# Patient Record
Sex: Female | Born: 1965 | Race: White | Hispanic: No | Marital: Married | Smoking: Never smoker
Health system: Southern US, Community
[De-identification: ages and names within clinical notes are randomized; demographics above are authoritative.]

---

## 1999-02-14 ENCOUNTER — Other Ambulatory Visit: Admission: RE | Admit: 1999-02-14 | Discharge: 1999-02-14 | Payer: Self-pay | Admitting: Gynecology

## 2000-03-05 ENCOUNTER — Other Ambulatory Visit: Admission: RE | Admit: 2000-03-05 | Discharge: 2000-03-05 | Payer: Self-pay | Admitting: Gynecology

## 2000-03-27 ENCOUNTER — Encounter: Payer: Self-pay | Admitting: Urology

## 2000-03-27 ENCOUNTER — Encounter: Admission: RE | Admit: 2000-03-27 | Discharge: 2000-03-27 | Payer: Self-pay | Admitting: Internal Medicine

## 2000-04-01 ENCOUNTER — Encounter: Admission: RE | Admit: 2000-04-01 | Discharge: 2000-04-01 | Payer: Self-pay | Admitting: Urology

## 2000-04-01 ENCOUNTER — Encounter: Payer: Self-pay | Admitting: Urology

## 2000-04-22 ENCOUNTER — Encounter: Admission: RE | Admit: 2000-04-22 | Discharge: 2000-04-22 | Payer: Self-pay | Admitting: Urology

## 2000-04-22 ENCOUNTER — Encounter: Payer: Self-pay | Admitting: Urology

## 2001-07-30 ENCOUNTER — Encounter: Payer: Self-pay | Admitting: Obstetrics & Gynecology

## 2001-07-30 ENCOUNTER — Ambulatory Visit (HOSPITAL_COMMUNITY): Admission: RE | Admit: 2001-07-30 | Discharge: 2001-07-30 | Payer: Self-pay | Admitting: Obstetrics & Gynecology

## 2001-08-27 ENCOUNTER — Ambulatory Visit (HOSPITAL_COMMUNITY): Admission: RE | Admit: 2001-08-27 | Discharge: 2001-08-27 | Payer: Self-pay | Admitting: Obstetrics & Gynecology

## 2001-08-27 ENCOUNTER — Encounter: Payer: Self-pay | Admitting: Obstetrics & Gynecology

## 2001-12-19 ENCOUNTER — Inpatient Hospital Stay (HOSPITAL_COMMUNITY): Admission: AD | Admit: 2001-12-19 | Discharge: 2001-12-24 | Payer: Self-pay | Admitting: Obstetrics and Gynecology

## 2001-12-19 ENCOUNTER — Encounter (INDEPENDENT_AMBULATORY_CARE_PROVIDER_SITE_OTHER): Payer: Self-pay | Admitting: Specialist

## 2002-01-21 ENCOUNTER — Other Ambulatory Visit: Admission: RE | Admit: 2002-01-21 | Discharge: 2002-01-21 | Payer: Self-pay | Admitting: Obstetrics and Gynecology

## 2003-02-27 ENCOUNTER — Other Ambulatory Visit: Admission: RE | Admit: 2003-02-27 | Discharge: 2003-02-27 | Payer: Self-pay | Admitting: Gynecology

## 2004-05-16 ENCOUNTER — Other Ambulatory Visit: Admission: RE | Admit: 2004-05-16 | Discharge: 2004-05-16 | Payer: Self-pay | Admitting: Gynecology

## 2005-04-23 ENCOUNTER — Encounter: Admission: RE | Admit: 2005-04-23 | Discharge: 2005-04-23 | Payer: Self-pay | Admitting: Gynecology

## 2006-04-29 ENCOUNTER — Encounter: Admission: RE | Admit: 2006-04-29 | Discharge: 2006-04-29 | Payer: Self-pay | Admitting: Gynecology

## 2007-04-30 ENCOUNTER — Encounter: Admission: RE | Admit: 2007-04-30 | Discharge: 2007-04-30 | Payer: Self-pay | Admitting: Gynecology

## 2008-05-01 ENCOUNTER — Encounter: Admission: RE | Admit: 2008-05-01 | Discharge: 2008-05-01 | Payer: Self-pay | Admitting: Gynecology

## 2009-01-26 ENCOUNTER — Encounter: Admission: RE | Admit: 2009-01-26 | Discharge: 2009-01-26 | Payer: Self-pay | Admitting: Internal Medicine

## 2009-05-17 ENCOUNTER — Encounter: Admission: RE | Admit: 2009-05-17 | Discharge: 2009-05-17 | Payer: Self-pay | Admitting: Gynecology

## 2010-06-07 NOTE — Discharge Summary (Signed)
NAME:  Brenda Nash, Brenda Nash NO.:  [id number]   MEDICAL RECORD NO.:  [id number]                   PATIENT TYPE:  INP   LOCATION:  9108                                 FACILITY:  [facility]   PHYSICIAN:  Randye Lobo, M.D.                DATE OF BIRTH:  October 29, 1965   DATE OF ADMISSION:  12/18/2001  DATE OF DISCHARGE:  12/24/2001                                 DISCHARGE SUMMARY   FINAL DIAGNOSES:  1. Twin gestation at 35-5/7 weeks.  2. HELLP syndrome.  3. Unfavorable cervix.   PROCEDURE:  Primary low transverse cesarean section.   SURGEON:  Carrington Clamp, M.D.   ASSISTANT:  Tracie Harrier, M.D.   COMPLICATIONS:  None.   HISTORY OF PRESENT ILLNESS:  This 45 year old G1, P0 was admitted at 35-6/[redacted]  weeks gestation with twins.  The patient was complaining of intermittent  chills and back pain.  The patient was started on terbutaline at this point  to stop her contractions.  The patient's antepartum course had been  complicated by in vitro fertilization which produced twins, a history of  HSV, and advanced maternal age.  The patient declined the amniocentesis.   HOSPITAL COURSE:  Upon admission to the hospital, the patient started having  some upper abdominal and lower back pain.  Labs showed an elevated LDH and  elevated liver function tests.  The patient's blood pressure started mild  elevations.  An ultrasound showed a normal AFI with good fetal heart tones.  A discussion was held with the patient and her family regarding delivery of  the twins secondary to HELLP syndrome and decision was made to proceed with  a cesarean section instead of a prolonged induction.  The patient was taken  to the operating room on December 19, 2001, by Dr. Carrington Clamp where a  primary low transverse cesarean section was performed with the delivery of  twin A weighing 5 pounds with Apgars of 8 and 9 and twin B weighing 6 pounds  with Apgars of 8 and 9.  Both were  males.  Delivery went without  complications.  Magnesium sulfate was started postoperatively.  After  delivery, the patient's liver function tests started normalizing and she was  diuresing.  She was able to be stopped on her magnesium sulfate on  postoperative day #1.  The patient also began to have some abdominal  distention.  A white blood cell count was performed on December 21, 2001,  which showed a white blood cell count of 15.8.  The patient did not have a  fever but endometritis was considered.  On postoperative day #3, the patient  was having some discomfort from her edema and distention of her abdomen.  White blood cell counts were checked to rule out endometritis.  The patient  was started on some hydrochlorothiazide for her edema.  Labs began to  normalize and by postoperative day #5,  the patient was felt ready for  discharge.    DISPOSITION:  She was sent home on hydrochlorothiazide for her lower  extremity edema, told to decrease activity, was given a prescription for  Percocet one to two every four hours as needed for pain, to continue her  prenatal vitamins and was to follow up in the office in one week.   LABS ON DISCHARGE:  Normal liver function tests and a postoperative  hemoglobin of 10.7.     Leilani Able, P.A.-C.                Randye Lobo, M.D.    MB/MEDQ  D:  02/02/2002  T:  02/02/2002  Job:  [id number]

## 2010-06-07 NOTE — Op Note (Signed)
NAME:  Brenda Nash, Brenda Nash NO.:  [id number]   MEDICAL RECORD NO.:  [id number]                   PATIENT TYPE:  INP   LOCATION:  9156                                 FACILITY:  [facility]   PHYSICIAN:  Carrington Clamp, M.D.              DATE OF BIRTH:  01-Feb-1965   DATE OF PROCEDURE:  12/19/2001  DATE OF DISCHARGE:                                 OPERATIVE REPORT   PREOPERATIVE DIAGNOSES:  1. Twins at 58 and five-sevenths weeks.  2. HELLP syndrome.  3. Unfavorable cervix.   POSTOPERATIVE DIAGNOSES:  1. Twins at 22 and five-sevenths weeks.  2. HELLP syndrome.  3. Unfavorable cervix.   PROCEDURE:  Primary low transverse cesarean section.   SURGEON:  Carrington Clamp, M.D.   ASSISTANT:  Tracie Harrier, M.D.   ANESTHESIA:  Spinal.   ESTIMATED BLOOD LOSS:  800 cc.   INTRAVENOUS FLUIDS:  1000 cc.   URINE OUTPUT:  250 cc.   COMPLICATIONS:  None.   FINDINGS:  A was female infant, Apgars 8 and 9, weighing 5 pounds in the  vertex presentation.  B was female, Apgars 8 and 9, 6 pounds, in the breech  presentation.  Normal tubes, ovaries, and uterus were seen.  However, there  were small, less than 2 cm fibroids scattered on the serosal surface of the  uterus.   PATHOLOGY:  Placenta.   MEDICATIONS:  Ancef and Pitocin.   COUNTS:  Counts were correct x3.   POSTOPERATIVE MEDICATIONS:  Magnesium sulfate was started postoperatively.   TECHNIQUE:  After adequate spinal anesthesia was achieved the patient was  prepped and draped in the usual sterile fashion, dorsal supine position with  leftward tilt.  A Pfannenstiel skin incision was made with the scalpel,  carried down to the fascia with the Bovie cautery.  The fascia was incised  in the midline and carried in a transverse curvilinear manner with the Mayo  scissors.  The fascia was reflected superiorly and inferiorly with the Mayo  scissors off of the rectus muscles and the rectus muscles split in the  midline.  The L3 portion of the peritoneum was entered into bluntly and the  peritoneum was incised in a superior-inferior manner with the Metzenbaum  scissors.   The bladder blade was placed and the vesicouterine fascia tented up and  incised in a transverse curvilinear manner with the Metzenbaum scissors.  The bladder flap was created bluntly and the bladder blade replaced.  A 2 cm  incision was made in the upper portion of the lower uterine segment and this  was carried in a transverse curvilinear manner with the bandage scissors.  Clear fluid was noted upon entry of both amniotic cavities and baby A was  delivered vertex, bulb suctioned, cord clamped and cut, and handed to  waiting pediatrics.  Baby B was delivered breech in the usual fashion and  was also bulb suctioned, with the cord  clamped and cut.  B was also handed  over to waiting pediatrics.   Cord bloods were then obtained and the placenta delivered in a manual  fashion.  The uterus was then exteriorized, wrapped in wet lap, and cleared  of all debris.  The incision was closed with a running lock stitch of 0  Monocryl.  Inspection of the incision found a few small areas of bleeding  and four figure-of-eight stitches were used to secure hemostasis.  The  uterus was then reapproximated in the abdomen and the abdomen irrigated and  the uterine incision reinspected and found to be hemostatic.   The peritoneum was then closed with a running stitch of 2-0 Vicryl.  The  fascia was closed with a running stitch of 0 Vicryl.  The subcutaneous  tissue was rendered hemostatic with the Bovie cautery.  It was slightly  edematous but was relatively thin - just under  3 cm - and so a drain was forgone.  Instead, five interrupted stitches of 3  plain gut were placed to close the subcutaneous space.  The skin was closed  with staples.  The patient tolerated the procedure well and returned to  recovery room in stable condition.                                                Carrington Clamp, M.D.    MH/MEDQ  D:  12/19/2001  T:  12/19/2001  Job:  [id number]

## 2010-06-11 ENCOUNTER — Other Ambulatory Visit: Payer: Self-pay | Admitting: Gynecology

## 2010-06-11 DIAGNOSIS — Z1231 Encounter for screening mammogram for malignant neoplasm of breast: Secondary | ICD-10-CM

## 2010-06-14 ENCOUNTER — Ambulatory Visit
Admission: RE | Admit: 2010-06-14 | Discharge: 2010-06-14 | Disposition: A | Discharge status: Home / Self Care | Payer: 59 | Source: Ambulatory Visit | Attending: Gynecology | Admitting: Gynecology

## 2010-06-14 DIAGNOSIS — Z1231 Encounter for screening mammogram for malignant neoplasm of breast: Secondary | ICD-10-CM

## 2011-07-01 ENCOUNTER — Other Ambulatory Visit: Payer: Self-pay | Admitting: Gynecology

## 2011-07-01 DIAGNOSIS — Z1231 Encounter for screening mammogram for malignant neoplasm of breast: Secondary | ICD-10-CM

## 2011-07-09 ENCOUNTER — Ambulatory Visit
Admission: RE | Admit: 2011-07-09 | Discharge: 2011-07-09 | Disposition: A | Discharge status: Home / Self Care | Payer: 59 | Source: Ambulatory Visit | Attending: Gynecology | Admitting: Gynecology

## 2011-07-09 DIAGNOSIS — Z1231 Encounter for screening mammogram for malignant neoplasm of breast: Secondary | ICD-10-CM

## 2012-09-09 ENCOUNTER — Other Ambulatory Visit: Payer: Self-pay

## 2012-09-09 DIAGNOSIS — Z1231 Encounter for screening mammogram for malignant neoplasm of breast: Secondary | ICD-10-CM

## 2012-09-29 ENCOUNTER — Ambulatory Visit
Admission: RE | Admit: 2012-09-29 | Discharge: 2012-09-29 | Disposition: A | Discharge status: Home / Self Care | Payer: 59 | Source: Ambulatory Visit

## 2012-09-29 DIAGNOSIS — Z1231 Encounter for screening mammogram for malignant neoplasm of breast: Secondary | ICD-10-CM

## 2012-12-23 ENCOUNTER — Other Ambulatory Visit: Payer: Self-pay | Admitting: Gynecology

## 2013-12-12 ENCOUNTER — Other Ambulatory Visit: Payer: Self-pay

## 2013-12-12 DIAGNOSIS — Z1231 Encounter for screening mammogram for malignant neoplasm of breast: Secondary | ICD-10-CM

## 2014-01-02 ENCOUNTER — Ambulatory Visit: Payer: 59

## 2014-01-03 ENCOUNTER — Ambulatory Visit
Admission: RE | Admit: 2014-01-03 | Discharge: 2014-01-03 | Disposition: A | Discharge status: Home / Self Care | Payer: 59 | Source: Ambulatory Visit

## 2014-01-03 DIAGNOSIS — Z1231 Encounter for screening mammogram for malignant neoplasm of breast: Secondary | ICD-10-CM

## 2014-01-05 ENCOUNTER — Other Ambulatory Visit: Payer: Self-pay | Admitting: Obstetrics & Gynecology

## 2014-01-05 DIAGNOSIS — R928 Other abnormal and inconclusive findings on diagnostic imaging of breast: Secondary | ICD-10-CM

## 2014-01-10 ENCOUNTER — Ambulatory Visit
Admission: RE | Admit: 2014-01-10 | Discharge: 2014-01-10 | Disposition: A | Discharge status: Home / Self Care | Payer: 59 | Source: Ambulatory Visit | Attending: Obstetrics & Gynecology | Admitting: Obstetrics & Gynecology

## 2014-01-10 DIAGNOSIS — R928 Other abnormal and inconclusive findings on diagnostic imaging of breast: Secondary | ICD-10-CM

## 2014-01-23 ENCOUNTER — Other Ambulatory Visit: Payer: 59

## 2015-02-19 ENCOUNTER — Other Ambulatory Visit: Payer: Self-pay

## 2015-02-19 DIAGNOSIS — Z1231 Encounter for screening mammogram for malignant neoplasm of breast: Secondary | ICD-10-CM

## 2015-03-02 ENCOUNTER — Ambulatory Visit
Admission: RE | Admit: 2015-03-02 | Discharge: 2015-03-02 | Disposition: A | Discharge status: Home / Self Care | Payer: Self-pay | Source: Ambulatory Visit

## 2015-03-02 DIAGNOSIS — Z1231 Encounter for screening mammogram for malignant neoplasm of breast: Secondary | ICD-10-CM

## 2015-11-21 ENCOUNTER — Other Ambulatory Visit: Payer: Self-pay | Admitting: Obstetrics & Gynecology

## 2015-11-23 LAB — CYTOLOGY - PAP

## 2016-02-07 DIAGNOSIS — Z23 Encounter for immunization: Secondary | ICD-10-CM | POA: Diagnosis not present

## 2016-02-27 ENCOUNTER — Other Ambulatory Visit: Payer: Self-pay | Admitting: Obstetrics & Gynecology

## 2016-02-27 DIAGNOSIS — Z1231 Encounter for screening mammogram for malignant neoplasm of breast: Secondary | ICD-10-CM

## 2016-03-26 DIAGNOSIS — H6983 Other specified disorders of Eustachian tube, bilateral: Secondary | ICD-10-CM | POA: Diagnosis not present

## 2016-03-26 DIAGNOSIS — J301 Allergic rhinitis due to pollen: Secondary | ICD-10-CM | POA: Diagnosis not present

## 2016-04-09 ENCOUNTER — Ambulatory Visit: Payer: 59

## 2016-04-29 ENCOUNTER — Ambulatory Visit
Admission: RE | Admit: 2016-04-29 | Discharge: 2016-04-29 | Disposition: A | Discharge status: Home / Self Care | Payer: 59 | Source: Ambulatory Visit | Attending: Obstetrics & Gynecology | Admitting: Obstetrics & Gynecology

## 2016-04-29 DIAGNOSIS — Z1231 Encounter for screening mammogram for malignant neoplasm of breast: Secondary | ICD-10-CM | POA: Diagnosis not present

## 2016-05-30 DIAGNOSIS — Z0000 Encounter for general adult medical examination without abnormal findings: Secondary | ICD-10-CM | POA: Diagnosis not present

## 2016-07-28 DIAGNOSIS — Z1211 Encounter for screening for malignant neoplasm of colon: Secondary | ICD-10-CM | POA: Diagnosis not present

## 2016-11-06 DIAGNOSIS — M9903 Segmental and somatic dysfunction of lumbar region: Secondary | ICD-10-CM | POA: Diagnosis not present

## 2016-11-06 DIAGNOSIS — M9902 Segmental and somatic dysfunction of thoracic region: Secondary | ICD-10-CM | POA: Diagnosis not present

## 2016-11-06 DIAGNOSIS — M9901 Segmental and somatic dysfunction of cervical region: Secondary | ICD-10-CM | POA: Diagnosis not present

## 2016-11-07 DIAGNOSIS — M9903 Segmental and somatic dysfunction of lumbar region: Secondary | ICD-10-CM | POA: Diagnosis not present

## 2016-11-07 DIAGNOSIS — M9901 Segmental and somatic dysfunction of cervical region: Secondary | ICD-10-CM | POA: Diagnosis not present

## 2016-11-07 DIAGNOSIS — M9902 Segmental and somatic dysfunction of thoracic region: Secondary | ICD-10-CM | POA: Diagnosis not present

## 2016-11-12 DIAGNOSIS — M9902 Segmental and somatic dysfunction of thoracic region: Secondary | ICD-10-CM | POA: Diagnosis not present

## 2016-11-12 DIAGNOSIS — M9901 Segmental and somatic dysfunction of cervical region: Secondary | ICD-10-CM | POA: Diagnosis not present

## 2016-11-12 DIAGNOSIS — M9903 Segmental and somatic dysfunction of lumbar region: Secondary | ICD-10-CM | POA: Diagnosis not present

## 2016-11-14 DIAGNOSIS — M9901 Segmental and somatic dysfunction of cervical region: Secondary | ICD-10-CM | POA: Diagnosis not present

## 2016-11-14 DIAGNOSIS — M9902 Segmental and somatic dysfunction of thoracic region: Secondary | ICD-10-CM | POA: Diagnosis not present

## 2016-11-14 DIAGNOSIS — M9903 Segmental and somatic dysfunction of lumbar region: Secondary | ICD-10-CM | POA: Diagnosis not present

## 2016-11-19 DIAGNOSIS — M9901 Segmental and somatic dysfunction of cervical region: Secondary | ICD-10-CM | POA: Diagnosis not present

## 2016-11-19 DIAGNOSIS — M9902 Segmental and somatic dysfunction of thoracic region: Secondary | ICD-10-CM | POA: Diagnosis not present

## 2016-11-19 DIAGNOSIS — M9903 Segmental and somatic dysfunction of lumbar region: Secondary | ICD-10-CM | POA: Diagnosis not present

## 2016-11-21 DIAGNOSIS — M9902 Segmental and somatic dysfunction of thoracic region: Secondary | ICD-10-CM | POA: Diagnosis not present

## 2016-11-21 DIAGNOSIS — M9901 Segmental and somatic dysfunction of cervical region: Secondary | ICD-10-CM | POA: Diagnosis not present

## 2016-11-21 DIAGNOSIS — M9903 Segmental and somatic dysfunction of lumbar region: Secondary | ICD-10-CM | POA: Diagnosis not present

## 2016-11-26 DIAGNOSIS — M9902 Segmental and somatic dysfunction of thoracic region: Secondary | ICD-10-CM | POA: Diagnosis not present

## 2016-11-26 DIAGNOSIS — M9901 Segmental and somatic dysfunction of cervical region: Secondary | ICD-10-CM | POA: Diagnosis not present

## 2016-11-26 DIAGNOSIS — M9903 Segmental and somatic dysfunction of lumbar region: Secondary | ICD-10-CM | POA: Diagnosis not present

## 2017-01-08 DIAGNOSIS — H04123 Dry eye syndrome of bilateral lacrimal glands: Secondary | ICD-10-CM | POA: Diagnosis not present

## 2017-03-24 DIAGNOSIS — D1801 Hemangioma of skin and subcutaneous tissue: Secondary | ICD-10-CM | POA: Diagnosis not present

## 2017-03-24 DIAGNOSIS — D2262 Melanocytic nevi of left upper limb, including shoulder: Secondary | ICD-10-CM | POA: Diagnosis not present

## 2017-03-24 DIAGNOSIS — L7211 Pilar cyst: Secondary | ICD-10-CM | POA: Diagnosis not present

## 2017-04-29 DIAGNOSIS — D3122 Benign neoplasm of left retina: Secondary | ICD-10-CM | POA: Diagnosis not present

## 2017-05-01 DIAGNOSIS — R42 Dizziness and giddiness: Secondary | ICD-10-CM | POA: Diagnosis not present

## 2017-05-01 DIAGNOSIS — M79609 Pain in unspecified limb: Secondary | ICD-10-CM | POA: Diagnosis not present

## 2017-05-01 DIAGNOSIS — R0600 Dyspnea, unspecified: Secondary | ICD-10-CM | POA: Diagnosis not present

## 2017-05-18 ENCOUNTER — Other Ambulatory Visit: Payer: Self-pay | Admitting: Obstetrics & Gynecology

## 2017-05-18 DIAGNOSIS — Z1231 Encounter for screening mammogram for malignant neoplasm of breast: Secondary | ICD-10-CM

## 2017-05-20 HISTORY — PX: BREAST BIOPSY: SHX20

## 2017-06-02 DIAGNOSIS — Z0000 Encounter for general adult medical examination without abnormal findings: Secondary | ICD-10-CM | POA: Diagnosis not present

## 2017-06-02 DIAGNOSIS — Z1231 Encounter for screening mammogram for malignant neoplasm of breast: Secondary | ICD-10-CM | POA: Diagnosis not present

## 2017-06-02 DIAGNOSIS — Z1211 Encounter for screening for malignant neoplasm of colon: Secondary | ICD-10-CM | POA: Diagnosis not present

## 2017-06-08 ENCOUNTER — Ambulatory Visit
Admission: RE | Admit: 2017-06-08 | Discharge: 2017-06-08 | Disposition: A | Discharge status: Home / Self Care | Payer: 59 | Source: Ambulatory Visit | Attending: Obstetrics & Gynecology | Admitting: Obstetrics & Gynecology

## 2017-06-08 DIAGNOSIS — Z1231 Encounter for screening mammogram for malignant neoplasm of breast: Secondary | ICD-10-CM | POA: Diagnosis not present

## 2017-06-09 ENCOUNTER — Other Ambulatory Visit: Payer: Self-pay | Admitting: Obstetrics & Gynecology

## 2017-06-09 DIAGNOSIS — R921 Mammographic calcification found on diagnostic imaging of breast: Secondary | ICD-10-CM

## 2017-06-12 ENCOUNTER — Other Ambulatory Visit: Payer: Self-pay | Admitting: Obstetrics & Gynecology

## 2017-06-12 ENCOUNTER — Ambulatory Visit
Admission: RE | Admit: 2017-06-12 | Discharge: 2017-06-12 | Disposition: A | Discharge status: Home / Self Care | Payer: 59 | Source: Ambulatory Visit | Attending: Obstetrics & Gynecology | Admitting: Obstetrics & Gynecology

## 2017-06-12 DIAGNOSIS — R921 Mammographic calcification found on diagnostic imaging of breast: Secondary | ICD-10-CM

## 2017-08-04 DIAGNOSIS — E7800 Pure hypercholesterolemia, unspecified: Secondary | ICD-10-CM | POA: Diagnosis not present

## 2017-08-26 DIAGNOSIS — E7800 Pure hypercholesterolemia, unspecified: Secondary | ICD-10-CM | POA: Diagnosis not present

## 2017-08-28 DIAGNOSIS — H15102 Unspecified episcleritis, left eye: Secondary | ICD-10-CM | POA: Diagnosis not present

## 2017-09-11 DIAGNOSIS — H15102 Unspecified episcleritis, left eye: Secondary | ICD-10-CM | POA: Diagnosis not present

## 2017-09-11 DIAGNOSIS — H11152 Pinguecula, left eye: Secondary | ICD-10-CM | POA: Diagnosis not present

## 2017-09-23 ENCOUNTER — Ambulatory Visit (HOSPITAL_COMMUNITY)
Admission: RE | Admit: 2017-09-23 | Discharge: 2017-09-23 | Disposition: A | Discharge status: Home / Self Care | Payer: 59 | Source: Ambulatory Visit | Attending: Internal Medicine | Admitting: Internal Medicine

## 2017-09-23 ENCOUNTER — Other Ambulatory Visit (HOSPITAL_COMMUNITY): Payer: Self-pay | Admitting: Nurse Practitioner

## 2017-09-23 DIAGNOSIS — M25572 Pain in left ankle and joints of left foot: Secondary | ICD-10-CM | POA: Insufficient documentation

## 2017-09-23 DIAGNOSIS — R52 Pain, unspecified: Secondary | ICD-10-CM | POA: Diagnosis not present

## 2017-09-23 DIAGNOSIS — S93432A Sprain of tibiofibular ligament of left ankle, initial encounter: Secondary | ICD-10-CM | POA: Diagnosis not present

## 2017-09-23 DIAGNOSIS — Z23 Encounter for immunization: Secondary | ICD-10-CM | POA: Diagnosis not present

## 2017-09-23 NOTE — Progress Notes (Signed)
*  Preliminary Results* Right lower extremity venous duplex completed. Right lower extremity is negative for deep vein thrombosis. There is no evidence of right Baker's cyst.  09/23/2017 11:15 AM  Abram Sander

## 2017-10-15 DIAGNOSIS — Z01419 Encounter for gynecological examination (general) (routine) without abnormal findings: Secondary | ICD-10-CM | POA: Diagnosis not present

## 2017-11-18 DIAGNOSIS — M79671 Pain in right foot: Secondary | ICD-10-CM | POA: Diagnosis not present

## 2017-11-18 DIAGNOSIS — M2011 Hallux valgus (acquired), right foot: Secondary | ICD-10-CM | POA: Diagnosis not present

## 2017-11-18 DIAGNOSIS — M79672 Pain in left foot: Secondary | ICD-10-CM | POA: Diagnosis not present

## 2017-12-14 DIAGNOSIS — M2011 Hallux valgus (acquired), right foot: Secondary | ICD-10-CM | POA: Diagnosis not present

## 2017-12-14 DIAGNOSIS — M2012 Hallux valgus (acquired), left foot: Secondary | ICD-10-CM | POA: Diagnosis not present

## 2017-12-14 DIAGNOSIS — M21612 Bunion of left foot: Secondary | ICD-10-CM | POA: Diagnosis not present

## 2017-12-14 DIAGNOSIS — S93144A Subluxation of metatarsophalangeal joint of right lesser toe(s), initial encounter: Secondary | ICD-10-CM | POA: Diagnosis not present

## 2017-12-14 DIAGNOSIS — M79672 Pain in left foot: Secondary | ICD-10-CM | POA: Diagnosis not present

## 2017-12-14 DIAGNOSIS — M21611 Bunion of right foot: Secondary | ICD-10-CM | POA: Diagnosis not present

## 2017-12-14 DIAGNOSIS — M79671 Pain in right foot: Secondary | ICD-10-CM | POA: Diagnosis not present

## 2017-12-28 DIAGNOSIS — E7800 Pure hypercholesterolemia, unspecified: Secondary | ICD-10-CM | POA: Diagnosis not present

## 2017-12-29 DIAGNOSIS — Z8249 Family history of ischemic heart disease and other diseases of the circulatory system: Secondary | ICD-10-CM | POA: Diagnosis not present

## 2017-12-29 DIAGNOSIS — E7800 Pure hypercholesterolemia, unspecified: Secondary | ICD-10-CM | POA: Diagnosis not present

## 2017-12-29 DIAGNOSIS — Z23 Encounter for immunization: Secondary | ICD-10-CM | POA: Diagnosis not present

## 2018-01-05 DIAGNOSIS — M216X1 Other acquired deformities of right foot: Secondary | ICD-10-CM | POA: Diagnosis not present

## 2018-01-05 DIAGNOSIS — M66871 Spontaneous rupture of other tendons, right ankle and foot: Secondary | ICD-10-CM | POA: Diagnosis not present

## 2018-01-05 DIAGNOSIS — M2011 Hallux valgus (acquired), right foot: Secondary | ICD-10-CM | POA: Diagnosis not present

## 2018-01-05 DIAGNOSIS — S93144A Subluxation of metatarsophalangeal joint of right lesser toe(s), initial encounter: Secondary | ICD-10-CM | POA: Diagnosis not present

## 2018-01-06 DIAGNOSIS — M2011 Hallux valgus (acquired), right foot: Secondary | ICD-10-CM | POA: Diagnosis not present

## 2018-01-19 DIAGNOSIS — S93144D Subluxation of metatarsophalangeal joint of right lesser toe(s), subsequent encounter: Secondary | ICD-10-CM | POA: Diagnosis not present

## 2018-01-19 DIAGNOSIS — S93521D Sprain of metatarsophalangeal joint of right great toe, subsequent encounter: Secondary | ICD-10-CM | POA: Diagnosis not present

## 2018-01-19 DIAGNOSIS — M2011 Hallux valgus (acquired), right foot: Secondary | ICD-10-CM | POA: Diagnosis not present

## 2018-02-03 DIAGNOSIS — M2011 Hallux valgus (acquired), right foot: Secondary | ICD-10-CM | POA: Diagnosis not present

## 2018-03-03 DIAGNOSIS — M2011 Hallux valgus (acquired), right foot: Secondary | ICD-10-CM | POA: Diagnosis not present

## 2018-03-31 DIAGNOSIS — M2011 Hallux valgus (acquired), right foot: Secondary | ICD-10-CM | POA: Diagnosis not present

## 2018-04-01 DIAGNOSIS — M25676 Stiffness of unspecified foot, not elsewhere classified: Secondary | ICD-10-CM | POA: Diagnosis not present

## 2018-04-01 DIAGNOSIS — R262 Difficulty in walking, not elsewhere classified: Secondary | ICD-10-CM | POA: Diagnosis not present

## 2018-04-01 DIAGNOSIS — M21619 Bunion of unspecified foot: Secondary | ICD-10-CM | POA: Diagnosis not present

## 2018-04-01 DIAGNOSIS — M21611 Bunion of right foot: Secondary | ICD-10-CM | POA: Diagnosis not present

## 2018-04-05 DIAGNOSIS — M21619 Bunion of unspecified foot: Secondary | ICD-10-CM | POA: Diagnosis not present

## 2018-04-05 DIAGNOSIS — M25676 Stiffness of unspecified foot, not elsewhere classified: Secondary | ICD-10-CM | POA: Diagnosis not present

## 2018-04-05 DIAGNOSIS — R262 Difficulty in walking, not elsewhere classified: Secondary | ICD-10-CM | POA: Diagnosis not present

## 2018-04-05 DIAGNOSIS — M21611 Bunion of right foot: Secondary | ICD-10-CM | POA: Diagnosis not present

## 2018-04-28 ENCOUNTER — Other Ambulatory Visit: Payer: Self-pay | Admitting: Obstetrics and Gynecology

## 2018-04-28 DIAGNOSIS — Z1231 Encounter for screening mammogram for malignant neoplasm of breast: Secondary | ICD-10-CM

## 2018-05-27 DIAGNOSIS — E7800 Pure hypercholesterolemia, unspecified: Secondary | ICD-10-CM | POA: Diagnosis not present

## 2018-05-27 DIAGNOSIS — L659 Nonscarring hair loss, unspecified: Secondary | ICD-10-CM | POA: Diagnosis not present

## 2018-05-28 DIAGNOSIS — E7800 Pure hypercholesterolemia, unspecified: Secondary | ICD-10-CM | POA: Diagnosis not present

## 2018-06-01 DIAGNOSIS — D225 Melanocytic nevi of trunk: Secondary | ICD-10-CM | POA: Diagnosis not present

## 2018-06-01 DIAGNOSIS — D2261 Melanocytic nevi of right upper limb, including shoulder: Secondary | ICD-10-CM | POA: Diagnosis not present

## 2018-06-01 DIAGNOSIS — D2271 Melanocytic nevi of right lower limb, including hip: Secondary | ICD-10-CM | POA: Diagnosis not present

## 2018-06-01 DIAGNOSIS — D2262 Melanocytic nevi of left upper limb, including shoulder: Secondary | ICD-10-CM | POA: Diagnosis not present

## 2018-06-16 DIAGNOSIS — M2011 Hallux valgus (acquired), right foot: Secondary | ICD-10-CM | POA: Diagnosis not present

## 2018-06-16 DIAGNOSIS — S93521D Sprain of metatarsophalangeal joint of right great toe, subsequent encounter: Secondary | ICD-10-CM | POA: Diagnosis not present

## 2018-06-16 DIAGNOSIS — S93144D Subluxation of metatarsophalangeal joint of right lesser toe(s), subsequent encounter: Secondary | ICD-10-CM | POA: Diagnosis not present

## 2018-06-28 ENCOUNTER — Ambulatory Visit
Admission: RE | Admit: 2018-06-28 | Discharge: 2018-06-28 | Disposition: A | Discharge status: Home / Self Care | Payer: BC Managed Care – PPO | Source: Ambulatory Visit | Attending: Obstetrics and Gynecology | Admitting: Obstetrics and Gynecology

## 2018-06-28 ENCOUNTER — Other Ambulatory Visit: Payer: Self-pay

## 2018-06-28 DIAGNOSIS — Z1231 Encounter for screening mammogram for malignant neoplasm of breast: Secondary | ICD-10-CM | POA: Diagnosis not present

## 2018-06-28 IMAGING — MG DIGITAL SCREENING BILATERAL MAMMOGRAM WITH TOMO AND CAD
6 of 10 series · 6 of 30 positions shown · non-contrast
Comparison: Previous exam(s).

CLINICAL DATA: Screening.

EXAM:
DIGITAL SCREENING BILATERAL MAMMOGRAM WITH TOMO AND CAD

[R MLO synth-2D (1 of 2)]
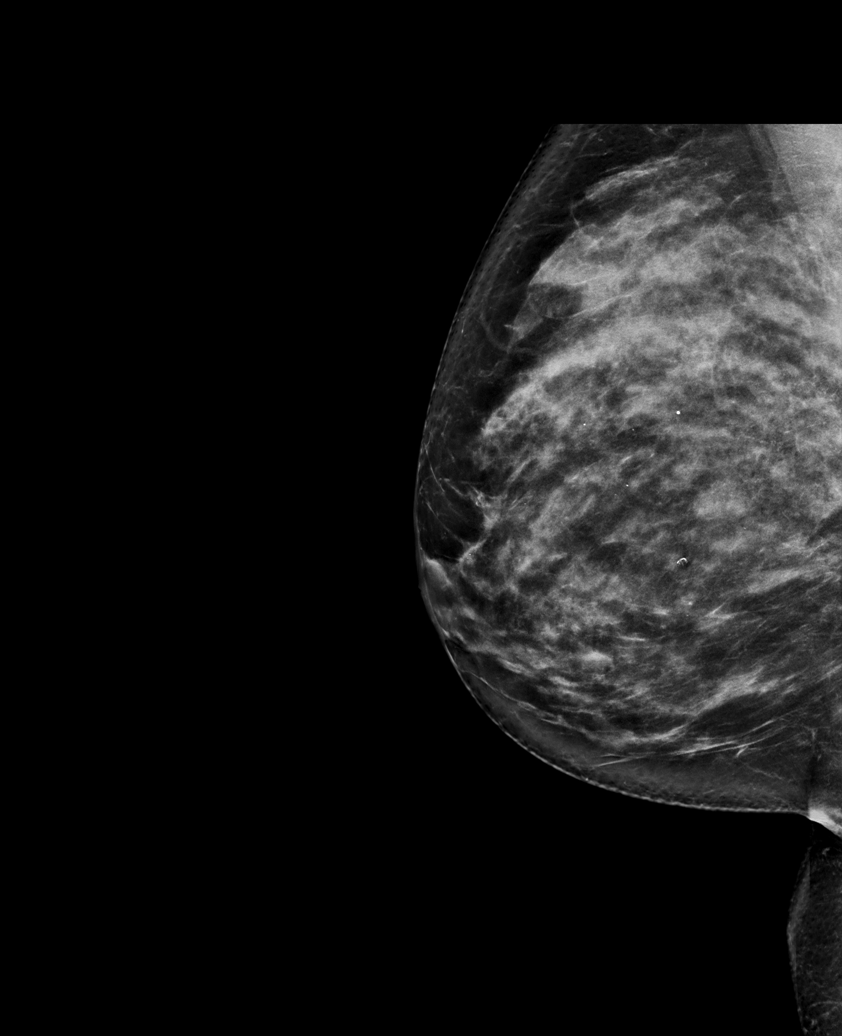

[R CC synth-2D]
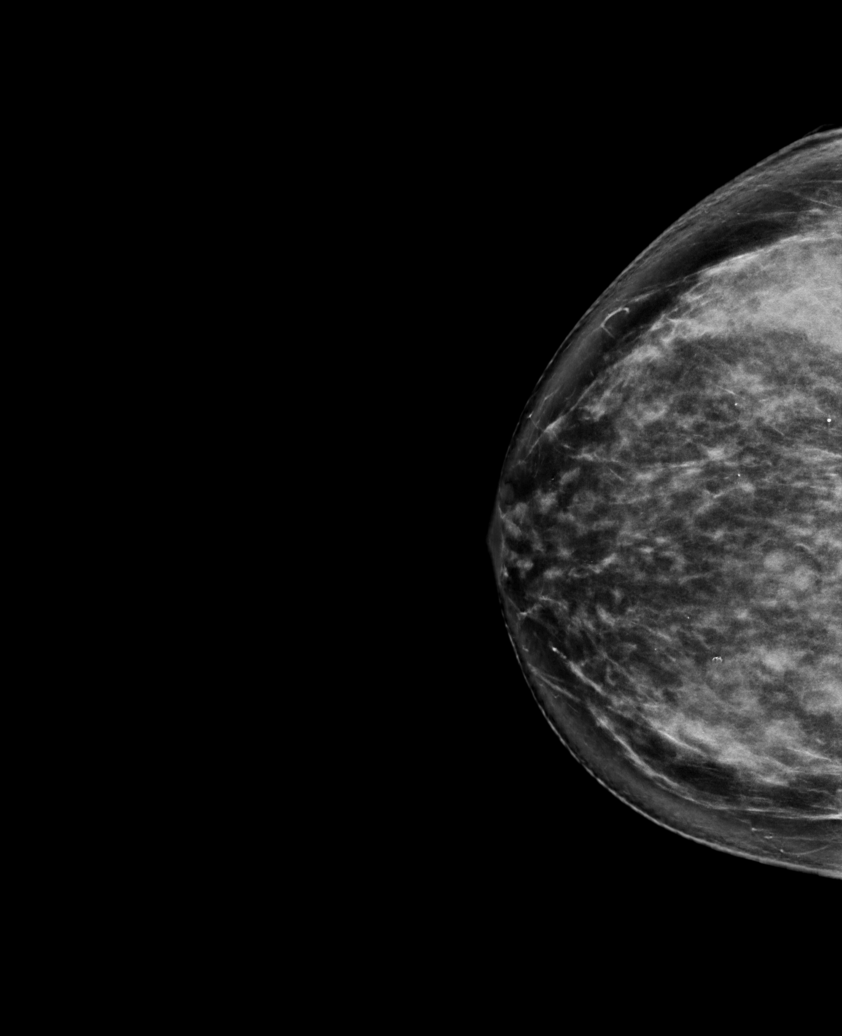

[R MLO synth-2D (2 of 2)]
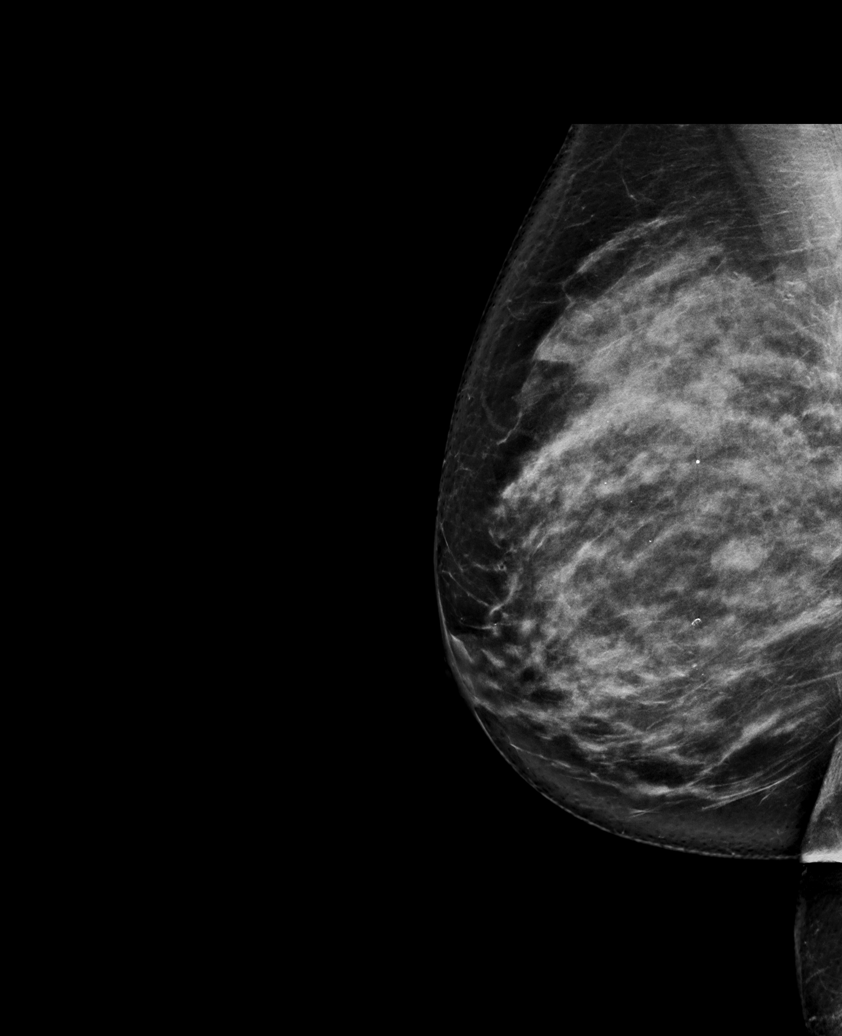

[L MLO synth-2D]
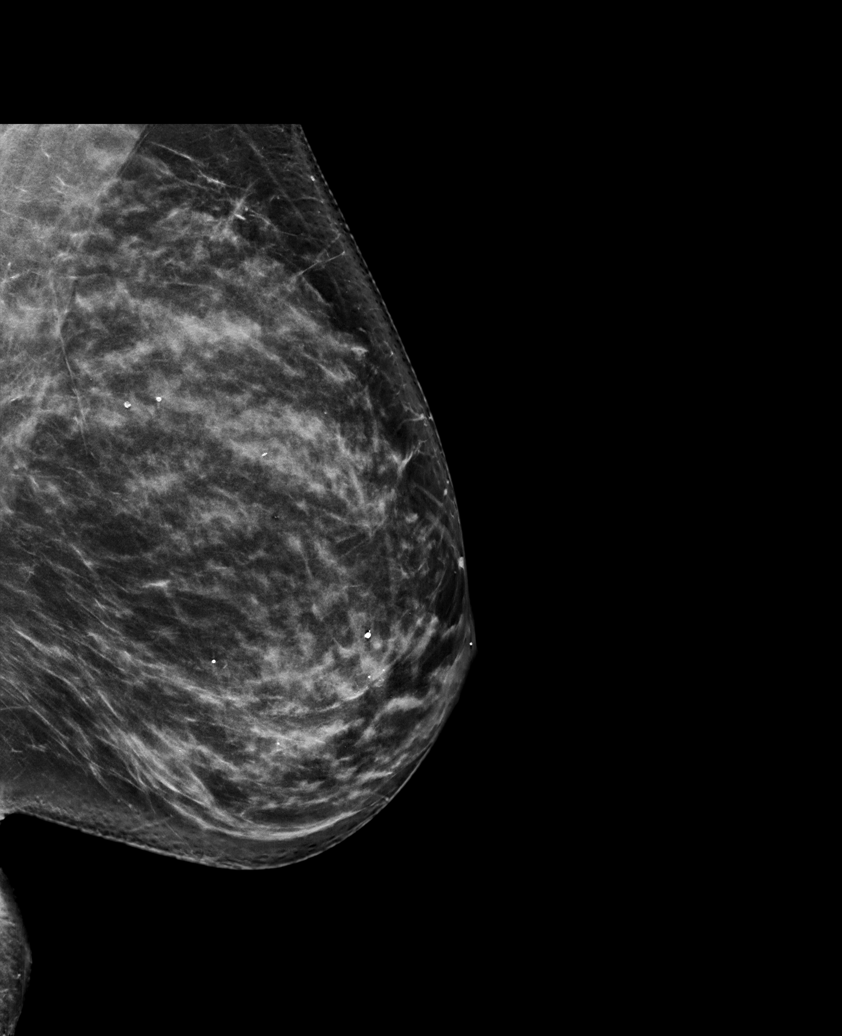

[L CC synth-2D]
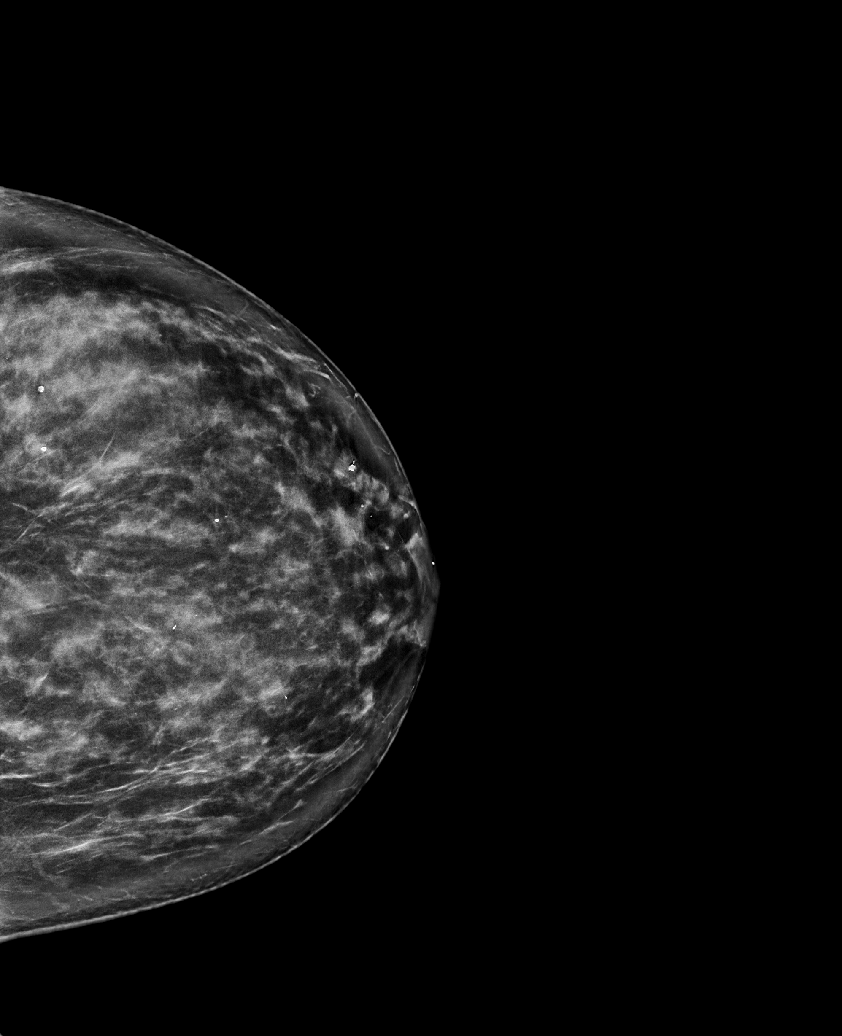

[L CC tomo · tomo slice 43/85.0]
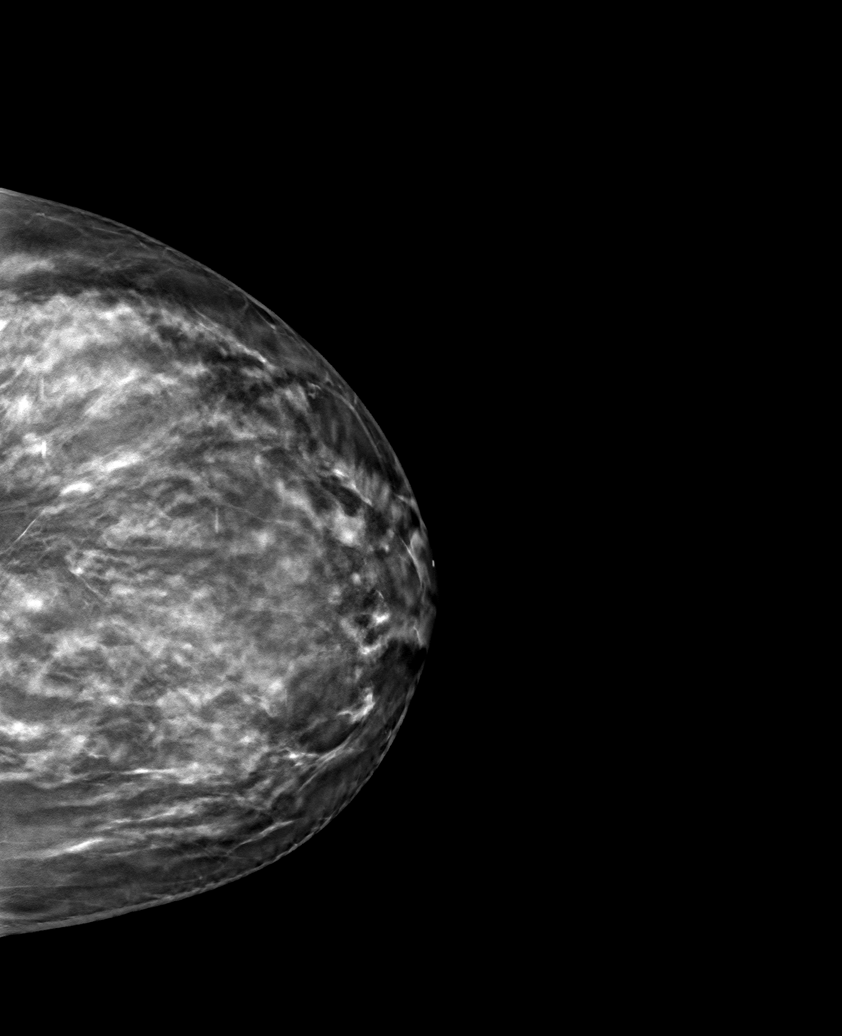

[6 of 30 positions shown; findings below may reference images not displayed]

ACR Breast Density Category c: The breast tissue is heterogeneously
dense, which may obscure small masses.
FINDINGS: There are no findings suspicious for malignancy. Images were
processed with CAD.
IMPRESSION: No mammographic evidence of malignancy. A result letter of this
screening mammogram will be mailed directly to the patient.

RECOMMENDATION:
Screening mammogram in one year. (Code:[5V])

BI-RADS CATEGORY  1: Negative.

## 2018-08-16 DIAGNOSIS — Z13220 Encounter for screening for lipoid disorders: Secondary | ICD-10-CM | POA: Diagnosis not present

## 2018-08-16 DIAGNOSIS — Z8371 Family history of colonic polyps: Secondary | ICD-10-CM | POA: Diagnosis not present

## 2018-08-16 DIAGNOSIS — Z0000 Encounter for general adult medical examination without abnormal findings: Secondary | ICD-10-CM | POA: Diagnosis not present

## 2018-08-16 DIAGNOSIS — Z1211 Encounter for screening for malignant neoplasm of colon: Secondary | ICD-10-CM | POA: Diagnosis not present

## 2018-11-11 DIAGNOSIS — H02834 Dermatochalasis of left upper eyelid: Secondary | ICD-10-CM | POA: Diagnosis not present

## 2018-11-11 DIAGNOSIS — H02831 Dermatochalasis of right upper eyelid: Secondary | ICD-10-CM | POA: Diagnosis not present

## 2019-02-08 DIAGNOSIS — Z6822 Body mass index (BMI) 22.0-22.9, adult: Secondary | ICD-10-CM | POA: Diagnosis not present

## 2019-02-08 DIAGNOSIS — Z01419 Encounter for gynecological examination (general) (routine) without abnormal findings: Secondary | ICD-10-CM | POA: Diagnosis not present

## 2019-02-21 DIAGNOSIS — R140 Abdominal distension (gaseous): Secondary | ICD-10-CM | POA: Diagnosis not present

## 2019-02-26 DIAGNOSIS — H02834 Dermatochalasis of left upper eyelid: Secondary | ICD-10-CM | POA: Diagnosis not present

## 2019-02-26 DIAGNOSIS — H02831 Dermatochalasis of right upper eyelid: Secondary | ICD-10-CM | POA: Diagnosis not present

## 2019-02-26 DIAGNOSIS — Z20822 Contact with and (suspected) exposure to covid-19: Secondary | ICD-10-CM | POA: Diagnosis not present

## 2019-03-01 DIAGNOSIS — H02831 Dermatochalasis of right upper eyelid: Secondary | ICD-10-CM | POA: Diagnosis not present

## 2019-03-01 DIAGNOSIS — H02834 Dermatochalasis of left upper eyelid: Secondary | ICD-10-CM | POA: Diagnosis not present

## 2019-03-01 DIAGNOSIS — H02832 Dermatochalasis of right lower eyelid: Secondary | ICD-10-CM | POA: Diagnosis not present

## 2019-03-01 DIAGNOSIS — H02836 Dermatochalasis of left eye, unspecified eyelid: Secondary | ICD-10-CM | POA: Diagnosis not present

## 2019-03-01 DIAGNOSIS — H02835 Dermatochalasis of left lower eyelid: Secondary | ICD-10-CM | POA: Diagnosis not present

## 2019-03-01 DIAGNOSIS — Z79899 Other long term (current) drug therapy: Secondary | ICD-10-CM | POA: Diagnosis not present

## 2019-03-01 DIAGNOSIS — H02833 Dermatochalasis of right eye, unspecified eyelid: Secondary | ICD-10-CM | POA: Diagnosis not present

## 2019-05-24 ENCOUNTER — Other Ambulatory Visit: Payer: Self-pay | Admitting: Obstetrics and Gynecology

## 2019-05-24 DIAGNOSIS — Z1231 Encounter for screening mammogram for malignant neoplasm of breast: Secondary | ICD-10-CM

## 2019-06-29 ENCOUNTER — Ambulatory Visit
Admission: RE | Admit: 2019-06-29 | Discharge: 2019-06-29 | Disposition: A | Discharge status: Home / Self Care | Payer: Self-pay | Source: Ambulatory Visit | Attending: Obstetrics and Gynecology | Admitting: Obstetrics and Gynecology

## 2019-06-29 ENCOUNTER — Other Ambulatory Visit: Payer: Self-pay

## 2019-06-29 DIAGNOSIS — Z1231 Encounter for screening mammogram for malignant neoplasm of breast: Secondary | ICD-10-CM

## 2019-08-17 DIAGNOSIS — L7211 Pilar cyst: Secondary | ICD-10-CM | POA: Diagnosis not present

## 2019-08-17 DIAGNOSIS — D2262 Melanocytic nevi of left upper limb, including shoulder: Secondary | ICD-10-CM | POA: Diagnosis not present

## 2019-08-17 DIAGNOSIS — Z0000 Encounter for general adult medical examination without abnormal findings: Secondary | ICD-10-CM | POA: Diagnosis not present

## 2019-08-17 DIAGNOSIS — E7800 Pure hypercholesterolemia, unspecified: Secondary | ICD-10-CM | POA: Diagnosis not present

## 2019-08-17 DIAGNOSIS — L819 Disorder of pigmentation, unspecified: Secondary | ICD-10-CM | POA: Diagnosis not present

## 2019-08-17 DIAGNOSIS — D2261 Melanocytic nevi of right upper limb, including shoulder: Secondary | ICD-10-CM | POA: Diagnosis not present

## 2020-04-17 ENCOUNTER — Other Ambulatory Visit: Payer: Self-pay | Admitting: Obstetrics and Gynecology

## 2020-04-17 DIAGNOSIS — Z1231 Encounter for screening mammogram for malignant neoplasm of breast: Secondary | ICD-10-CM

## 2020-05-16 DIAGNOSIS — N915 Oligomenorrhea, unspecified: Secondary | ICD-10-CM | POA: Diagnosis not present

## 2020-05-16 DIAGNOSIS — Z6821 Body mass index (BMI) 21.0-21.9, adult: Secondary | ICD-10-CM | POA: Diagnosis not present

## 2020-05-16 DIAGNOSIS — Z01419 Encounter for gynecological examination (general) (routine) without abnormal findings: Secondary | ICD-10-CM | POA: Diagnosis not present

## 2020-05-16 DIAGNOSIS — Z124 Encounter for screening for malignant neoplasm of cervix: Secondary | ICD-10-CM | POA: Diagnosis not present

## 2020-05-23 DIAGNOSIS — Z9889 Other specified postprocedural states: Secondary | ICD-10-CM | POA: Diagnosis not present

## 2020-05-23 DIAGNOSIS — H5213 Myopia, bilateral: Secondary | ICD-10-CM | POA: Diagnosis not present

## 2020-05-23 DIAGNOSIS — H15102 Unspecified episcleritis, left eye: Secondary | ICD-10-CM | POA: Diagnosis not present

## 2020-05-23 DIAGNOSIS — H11152 Pinguecula, left eye: Secondary | ICD-10-CM | POA: Diagnosis not present

## 2020-07-10 DIAGNOSIS — M546 Pain in thoracic spine: Secondary | ICD-10-CM | POA: Diagnosis not present

## 2020-07-11 ENCOUNTER — Ambulatory Visit
Admission: RE | Admit: 2020-07-11 | Discharge: 2020-07-11 | Disposition: A | Discharge status: Home / Self Care | Payer: BC Managed Care – PPO | Source: Ambulatory Visit | Attending: Obstetrics and Gynecology | Admitting: Obstetrics and Gynecology

## 2020-07-11 ENCOUNTER — Other Ambulatory Visit: Payer: Self-pay

## 2020-07-11 DIAGNOSIS — Z1231 Encounter for screening mammogram for malignant neoplasm of breast: Secondary | ICD-10-CM | POA: Diagnosis not present

## 2020-07-31 DIAGNOSIS — M546 Pain in thoracic spine: Secondary | ICD-10-CM | POA: Diagnosis not present

## 2020-08-07 ENCOUNTER — Other Ambulatory Visit: Payer: Self-pay | Admitting: Sports Medicine

## 2020-08-07 ENCOUNTER — Ambulatory Visit
Admission: RE | Admit: 2020-08-07 | Discharge: 2020-08-07 | Disposition: A | Discharge status: Home / Self Care | Payer: BC Managed Care – PPO | Source: Ambulatory Visit | Attending: Sports Medicine | Admitting: Sports Medicine

## 2020-08-07 ENCOUNTER — Other Ambulatory Visit: Payer: Self-pay

## 2020-08-07 DIAGNOSIS — M7731 Calcaneal spur, right foot: Secondary | ICD-10-CM | POA: Diagnosis not present

## 2020-08-07 DIAGNOSIS — Z9889 Other specified postprocedural states: Secondary | ICD-10-CM | POA: Diagnosis not present

## 2020-08-07 DIAGNOSIS — M79671 Pain in right foot: Secondary | ICD-10-CM

## 2020-08-16 DIAGNOSIS — E559 Vitamin D deficiency, unspecified: Secondary | ICD-10-CM | POA: Diagnosis not present

## 2020-08-16 DIAGNOSIS — E7800 Pure hypercholesterolemia, unspecified: Secondary | ICD-10-CM | POA: Diagnosis not present

## 2020-08-16 DIAGNOSIS — Z0000 Encounter for general adult medical examination without abnormal findings: Secondary | ICD-10-CM | POA: Diagnosis not present

## 2020-08-20 ENCOUNTER — Other Ambulatory Visit: Payer: Self-pay | Admitting: Internal Medicine

## 2020-08-20 DIAGNOSIS — H15102 Unspecified episcleritis, left eye: Secondary | ICD-10-CM | POA: Diagnosis not present

## 2020-08-20 DIAGNOSIS — H11152 Pinguecula, left eye: Secondary | ICD-10-CM | POA: Diagnosis not present

## 2020-08-20 DIAGNOSIS — Z9889 Other specified postprocedural states: Secondary | ICD-10-CM | POA: Diagnosis not present

## 2020-08-20 DIAGNOSIS — E2839 Other primary ovarian failure: Secondary | ICD-10-CM

## 2020-08-21 DIAGNOSIS — M79671 Pain in right foot: Secondary | ICD-10-CM | POA: Diagnosis not present

## 2020-08-21 DIAGNOSIS — M546 Pain in thoracic spine: Secondary | ICD-10-CM | POA: Diagnosis not present

## 2020-09-04 DIAGNOSIS — M546 Pain in thoracic spine: Secondary | ICD-10-CM | POA: Diagnosis not present

## 2020-09-13 ENCOUNTER — Other Ambulatory Visit: Payer: Self-pay

## 2020-09-13 ENCOUNTER — Ambulatory Visit
Admission: RE | Admit: 2020-09-13 | Discharge: 2020-09-13 | Disposition: A | Discharge status: Home / Self Care | Payer: BC Managed Care – PPO | Source: Ambulatory Visit | Attending: Internal Medicine | Admitting: Internal Medicine

## 2020-09-13 DIAGNOSIS — E2839 Other primary ovarian failure: Secondary | ICD-10-CM

## 2020-09-13 DIAGNOSIS — Z780 Asymptomatic menopausal state: Secondary | ICD-10-CM | POA: Diagnosis not present

## 2020-12-03 DIAGNOSIS — E7800 Pure hypercholesterolemia, unspecified: Secondary | ICD-10-CM | POA: Diagnosis not present

## 2020-12-03 DIAGNOSIS — K5909 Other constipation: Secondary | ICD-10-CM | POA: Diagnosis not present

## 2020-12-03 DIAGNOSIS — R5383 Other fatigue: Secondary | ICD-10-CM | POA: Diagnosis not present

## 2020-12-03 DIAGNOSIS — Z780 Asymptomatic menopausal state: Secondary | ICD-10-CM | POA: Diagnosis not present

## 2021-01-08 DIAGNOSIS — R140 Abdominal distension (gaseous): Secondary | ICD-10-CM | POA: Diagnosis not present

## 2021-01-08 DIAGNOSIS — K5909 Other constipation: Secondary | ICD-10-CM | POA: Diagnosis not present

## 2021-01-08 DIAGNOSIS — R5383 Other fatigue: Secondary | ICD-10-CM | POA: Diagnosis not present

## 2021-01-08 DIAGNOSIS — Z780 Asymptomatic menopausal state: Secondary | ICD-10-CM | POA: Diagnosis not present

## 2021-03-07 DIAGNOSIS — K5909 Other constipation: Secondary | ICD-10-CM | POA: Diagnosis not present

## 2021-03-07 DIAGNOSIS — R635 Abnormal weight gain: Secondary | ICD-10-CM | POA: Diagnosis not present

## 2021-03-07 DIAGNOSIS — E559 Vitamin D deficiency, unspecified: Secondary | ICD-10-CM | POA: Diagnosis not present

## 2021-04-12 DIAGNOSIS — K5900 Constipation, unspecified: Secondary | ICD-10-CM | POA: Diagnosis not present

## 2021-05-24 ENCOUNTER — Other Ambulatory Visit: Payer: Self-pay | Admitting: Gastroenterology

## 2021-05-24 ENCOUNTER — Ambulatory Visit
Admission: RE | Admit: 2021-05-24 | Discharge: 2021-05-24 | Disposition: A | Discharge status: Home / Self Care | Payer: BC Managed Care – PPO | Source: Ambulatory Visit | Attending: Gastroenterology | Admitting: Gastroenterology

## 2021-05-24 DIAGNOSIS — R5383 Other fatigue: Secondary | ICD-10-CM | POA: Diagnosis not present

## 2021-05-24 DIAGNOSIS — N950 Postmenopausal bleeding: Secondary | ICD-10-CM | POA: Diagnosis not present

## 2021-05-24 DIAGNOSIS — K5900 Constipation, unspecified: Secondary | ICD-10-CM

## 2021-05-30 DIAGNOSIS — Z6822 Body mass index (BMI) 22.0-22.9, adult: Secondary | ICD-10-CM | POA: Diagnosis not present

## 2021-05-30 DIAGNOSIS — Z01419 Encounter for gynecological examination (general) (routine) without abnormal findings: Secondary | ICD-10-CM | POA: Diagnosis not present

## 2021-06-20 ENCOUNTER — Other Ambulatory Visit: Payer: Self-pay | Admitting: Obstetrics and Gynecology

## 2021-06-20 DIAGNOSIS — Z1231 Encounter for screening mammogram for malignant neoplasm of breast: Secondary | ICD-10-CM

## 2021-07-12 ENCOUNTER — Ambulatory Visit
Admission: RE | Admit: 2021-07-12 | Discharge: 2021-07-12 | Disposition: A | Discharge status: Home / Self Care | Payer: BC Managed Care – PPO | Source: Ambulatory Visit | Attending: Obstetrics and Gynecology | Admitting: Obstetrics and Gynecology

## 2021-07-12 DIAGNOSIS — Z1231 Encounter for screening mammogram for malignant neoplasm of breast: Secondary | ICD-10-CM | POA: Diagnosis not present

## 2021-07-16 DIAGNOSIS — H15102 Unspecified episcleritis, left eye: Secondary | ICD-10-CM | POA: Diagnosis not present

## 2021-07-16 DIAGNOSIS — Z9889 Other specified postprocedural states: Secondary | ICD-10-CM | POA: Diagnosis not present

## 2021-07-16 DIAGNOSIS — H11152 Pinguecula, left eye: Secondary | ICD-10-CM | POA: Diagnosis not present

## 2021-08-23 DIAGNOSIS — K5901 Slow transit constipation: Secondary | ICD-10-CM | POA: Diagnosis not present

## 2021-08-23 DIAGNOSIS — E7800 Pure hypercholesterolemia, unspecified: Secondary | ICD-10-CM | POA: Diagnosis not present

## 2021-08-23 DIAGNOSIS — N951 Menopausal and female climacteric states: Secondary | ICD-10-CM | POA: Diagnosis not present

## 2021-08-23 DIAGNOSIS — Z0000 Encounter for general adult medical examination without abnormal findings: Secondary | ICD-10-CM | POA: Diagnosis not present

## 2021-08-23 DIAGNOSIS — E559 Vitamin D deficiency, unspecified: Secondary | ICD-10-CM | POA: Diagnosis not present

## 2021-10-23 ENCOUNTER — Other Ambulatory Visit (HOSPITAL_BASED_OUTPATIENT_CLINIC_OR_DEPARTMENT_OTHER): Payer: Self-pay | Admitting: Internal Medicine

## 2021-10-23 DIAGNOSIS — E7800 Pure hypercholesterolemia, unspecified: Secondary | ICD-10-CM | POA: Diagnosis not present

## 2021-10-23 DIAGNOSIS — Z23 Encounter for immunization: Secondary | ICD-10-CM | POA: Diagnosis not present

## 2021-10-23 DIAGNOSIS — L723 Sebaceous cyst: Secondary | ICD-10-CM | POA: Diagnosis not present

## 2021-10-23 DIAGNOSIS — F411 Generalized anxiety disorder: Secondary | ICD-10-CM | POA: Diagnosis not present

## 2021-10-23 DIAGNOSIS — K5901 Slow transit constipation: Secondary | ICD-10-CM | POA: Diagnosis not present

## 2021-11-13 ENCOUNTER — Ambulatory Visit (HOSPITAL_COMMUNITY)
Admission: RE | Admit: 2021-11-13 | Discharge: 2021-11-13 | Disposition: A | Discharge status: Home / Self Care | Payer: BC Managed Care – PPO | Source: Ambulatory Visit | Attending: Internal Medicine | Admitting: Internal Medicine

## 2021-11-13 DIAGNOSIS — E7800 Pure hypercholesterolemia, unspecified: Secondary | ICD-10-CM | POA: Insufficient documentation

## 2022-03-25 ENCOUNTER — Other Ambulatory Visit: Payer: Self-pay | Admitting: Obstetrics and Gynecology

## 2022-03-25 DIAGNOSIS — Z1231 Encounter for screening mammogram for malignant neoplasm of breast: Secondary | ICD-10-CM

## 2022-03-25 DIAGNOSIS — Z803 Family history of malignant neoplasm of breast: Secondary | ICD-10-CM | POA: Diagnosis not present

## 2022-03-25 DIAGNOSIS — N951 Menopausal and female climacteric states: Secondary | ICD-10-CM | POA: Diagnosis not present

## 2022-03-31 DIAGNOSIS — D225 Melanocytic nevi of trunk: Secondary | ICD-10-CM | POA: Diagnosis not present

## 2022-03-31 DIAGNOSIS — L821 Other seborrheic keratosis: Secondary | ICD-10-CM | POA: Diagnosis not present

## 2022-03-31 DIAGNOSIS — L7211 Pilar cyst: Secondary | ICD-10-CM | POA: Diagnosis not present

## 2022-07-15 ENCOUNTER — Ambulatory Visit
Admission: RE | Admit: 2022-07-15 | Discharge: 2022-07-15 | Disposition: A | Discharge status: Home / Self Care | Payer: BC Managed Care – PPO | Source: Ambulatory Visit | Attending: Obstetrics and Gynecology | Admitting: Obstetrics and Gynecology

## 2022-07-15 DIAGNOSIS — Z1231 Encounter for screening mammogram for malignant neoplasm of breast: Secondary | ICD-10-CM | POA: Diagnosis not present

## 2022-08-18 DIAGNOSIS — H11152 Pinguecula, left eye: Secondary | ICD-10-CM | POA: Diagnosis not present

## 2022-08-18 DIAGNOSIS — H2513 Age-related nuclear cataract, bilateral: Secondary | ICD-10-CM | POA: Diagnosis not present

## 2022-08-18 DIAGNOSIS — H15102 Unspecified episcleritis, left eye: Secondary | ICD-10-CM | POA: Diagnosis not present

## 2022-09-05 DIAGNOSIS — K5900 Constipation, unspecified: Secondary | ICD-10-CM | POA: Diagnosis not present

## 2022-09-08 DIAGNOSIS — Z0000 Encounter for general adult medical examination without abnormal findings: Secondary | ICD-10-CM | POA: Diagnosis not present

## 2022-09-08 DIAGNOSIS — E785 Hyperlipidemia, unspecified: Secondary | ICD-10-CM | POA: Diagnosis not present

## 2022-09-08 DIAGNOSIS — Z803 Family history of malignant neoplasm of breast: Secondary | ICD-10-CM | POA: Diagnosis not present

## 2022-09-08 DIAGNOSIS — E559 Vitamin D deficiency, unspecified: Secondary | ICD-10-CM | POA: Diagnosis not present

## 2022-09-08 DIAGNOSIS — K5901 Slow transit constipation: Secondary | ICD-10-CM | POA: Diagnosis not present

## 2022-09-08 DIAGNOSIS — N951 Menopausal and female climacteric states: Secondary | ICD-10-CM | POA: Diagnosis not present

## 2022-09-18 DIAGNOSIS — N6459 Other signs and symptoms in breast: Secondary | ICD-10-CM | POA: Diagnosis not present

## 2022-11-17 DIAGNOSIS — E785 Hyperlipidemia, unspecified: Secondary | ICD-10-CM | POA: Diagnosis not present

## 2023-03-10 ENCOUNTER — Other Ambulatory Visit: Payer: Self-pay | Admitting: Obstetrics and Gynecology

## 2023-03-10 DIAGNOSIS — Z1231 Encounter for screening mammogram for malignant neoplasm of breast: Secondary | ICD-10-CM

## 2023-03-26 DIAGNOSIS — Z124 Encounter for screening for malignant neoplasm of cervix: Secondary | ICD-10-CM | POA: Diagnosis not present

## 2023-03-26 DIAGNOSIS — Z01419 Encounter for gynecological examination (general) (routine) without abnormal findings: Secondary | ICD-10-CM | POA: Diagnosis not present

## 2023-03-26 DIAGNOSIS — Z1331 Encounter for screening for depression: Secondary | ICD-10-CM | POA: Diagnosis not present

## 2023-03-31 ENCOUNTER — Other Ambulatory Visit: Payer: Self-pay | Admitting: Obstetrics and Gynecology

## 2023-03-31 DIAGNOSIS — N6489 Other specified disorders of breast: Secondary | ICD-10-CM

## 2023-04-08 ENCOUNTER — Ambulatory Visit
Admission: RE | Admit: 2023-04-08 | Discharge: 2023-04-08 | Disposition: A | Discharge status: Home / Self Care | Source: Ambulatory Visit | Attending: Obstetrics and Gynecology | Admitting: Obstetrics and Gynecology

## 2023-04-08 ENCOUNTER — Other Ambulatory Visit: Payer: Self-pay | Admitting: Obstetrics and Gynecology

## 2023-04-08 DIAGNOSIS — N6310 Unspecified lump in the right breast, unspecified quadrant: Secondary | ICD-10-CM | POA: Diagnosis not present

## 2023-04-08 DIAGNOSIS — Z1231 Encounter for screening mammogram for malignant neoplasm of breast: Secondary | ICD-10-CM

## 2023-04-08 DIAGNOSIS — N6489 Other specified disorders of breast: Secondary | ICD-10-CM

## 2023-05-08 ENCOUNTER — Other Ambulatory Visit

## 2023-05-08 ENCOUNTER — Encounter

## 2023-06-09 DIAGNOSIS — D2262 Melanocytic nevi of left upper limb, including shoulder: Secondary | ICD-10-CM | POA: Diagnosis not present

## 2023-06-09 DIAGNOSIS — D2271 Melanocytic nevi of right lower limb, including hip: Secondary | ICD-10-CM | POA: Diagnosis not present

## 2023-06-09 DIAGNOSIS — D225 Melanocytic nevi of trunk: Secondary | ICD-10-CM | POA: Diagnosis not present

## 2023-06-09 DIAGNOSIS — L819 Disorder of pigmentation, unspecified: Secondary | ICD-10-CM | POA: Diagnosis not present

## 2023-07-16 ENCOUNTER — Ambulatory Visit
Admission: RE | Admit: 2023-07-16 | Discharge: 2023-07-16 | Disposition: A | Discharge status: Home / Self Care | Source: Ambulatory Visit | Attending: Obstetrics and Gynecology | Admitting: Obstetrics and Gynecology

## 2023-07-16 ENCOUNTER — Ambulatory Visit: Payer: BC Managed Care – PPO

## 2023-07-16 DIAGNOSIS — Z1231 Encounter for screening mammogram for malignant neoplasm of breast: Secondary | ICD-10-CM

## 2023-08-26 DIAGNOSIS — H2513 Age-related nuclear cataract, bilateral: Secondary | ICD-10-CM | POA: Diagnosis not present

## 2023-08-26 DIAGNOSIS — H11152 Pinguecula, left eye: Secondary | ICD-10-CM | POA: Diagnosis not present

## 2023-09-14 DIAGNOSIS — K5901 Slow transit constipation: Secondary | ICD-10-CM | POA: Diagnosis not present

## 2023-09-14 DIAGNOSIS — E559 Vitamin D deficiency, unspecified: Secondary | ICD-10-CM | POA: Diagnosis not present

## 2023-09-14 DIAGNOSIS — E7800 Pure hypercholesterolemia, unspecified: Secondary | ICD-10-CM | POA: Diagnosis not present

## 2023-09-14 DIAGNOSIS — R635 Abnormal weight gain: Secondary | ICD-10-CM | POA: Diagnosis not present

## 2023-09-14 DIAGNOSIS — Z0000 Encounter for general adult medical examination without abnormal findings: Secondary | ICD-10-CM | POA: Diagnosis not present

## 2023-11-05 DIAGNOSIS — K5901 Slow transit constipation: Secondary | ICD-10-CM | POA: Diagnosis not present

## 2023-11-05 DIAGNOSIS — Z83719 Family history of colon polyps, unspecified: Secondary | ICD-10-CM | POA: Diagnosis not present

## 2023-12-30 ENCOUNTER — Encounter (HOSPITAL_BASED_OUTPATIENT_CLINIC_OR_DEPARTMENT_OTHER): Payer: Self-pay

## 2023-12-31 ENCOUNTER — Telehealth (HOSPITAL_BASED_OUTPATIENT_CLINIC_OR_DEPARTMENT_OTHER): Payer: Self-pay | Admitting: *Deleted

## 2023-12-31 ENCOUNTER — Ambulatory Visit (HOSPITAL_BASED_OUTPATIENT_CLINIC_OR_DEPARTMENT_OTHER): Admitting: Internal Medicine

## 2023-12-31 ENCOUNTER — Encounter (HOSPITAL_BASED_OUTPATIENT_CLINIC_OR_DEPARTMENT_OTHER): Payer: Self-pay | Admitting: Internal Medicine

## 2023-12-31 VITALS — BP 104/62 | HR 67 | Ht 68.0 in | Wt 151.4 lb

## 2023-12-31 DIAGNOSIS — E7849 Other hyperlipidemia: Secondary | ICD-10-CM | POA: Diagnosis not present

## 2023-12-31 DIAGNOSIS — Z789 Other specified health status: Secondary | ICD-10-CM | POA: Diagnosis not present

## 2023-12-31 NOTE — Progress Notes (Signed)
 LIPID CLINIC CONSULT NOTE  Chief Complaint:  Manage dyslipidemia  Primary Care Physician: Brenda Charm, MD  Primary Cardiologist:  None  HPI:  Brenda Nash is a 58 y.o. female who is being seen today for the evaluation of dyslipidemia at the request of Brenda Charm, MD. this is a pleasant 58 year old female [facility] referred for evaluation management of dyslipidemia.  She has a strong family history of heart disease in both parents who both had heart attacks, her father having had several.  Her cholesterol has been high for most of her life.  Her total cholesterol recently was 301 with  HDL 73, triglycerides 139 and LDL 203.  Unfortunately she is intolerant of statins, having caused primarily constipation issues.  This all seem to resolve after stopping the statins.  PMHx:  No past medical history on file.  Past Surgical History:  Procedure Laterality Date   BREAST BIOPSY Left 05/2017    FAMHx:  Family History  Problem Relation Age of Onset   Heart attack Mother    Breast cancer Mother    Heart attack Father    Parkinson's disease Maternal Grandmother    Diabetes Other     SOCHx:   reports that she has never smoked. She has never used smokeless tobacco. She reports that she does not use drugs. No history on file for alcohol use.  ALLERGIES:  Allergies[1]  ROS: Pertinent items noted in HPI and remainder of comprehensive ROS otherwise negative.  HOME MEDS: Medications Ordered Prior to Encounter[2]  LABS/IMAGING: No results found for this or any previous visit (from the past 48 hours). No results found.  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   Brenda Nash has probable familial hyperlipidemia with LDL greater than 190.  Unfortunately she could not tolerate statins having had intolerance.  Based on this she is a good candidate for PCSK9 inhibitor.  Would recommend Repatha and will reach out for prior authorization for this.  In addition she is interested in genetic testing.  I think this could be helpful to further understand her risk and for cascade screening.  I will contact her with those results which can take up to about 3 weeks to get back.  This is generally covered by commercial insurance but if not the most it would cost is $299.  Will plan otherwise repeat lipids in about 3 months including NMR and LP(a).  Thanks again for the kind referral.  Brenda KYM Maxcy, MD, [facility], FNLA, FACP  [place]  [facility]  Medical Director of the Advanced Lipid Disorders &  Cardiovascular Risk Reduction Clinic Diplomate of the American Board of Clinical Lipidology Attending Cardiologist  Direct Dial: [phone]  Fax: [phone]  Website:  [email or web address]  Brenda Nash 12/31/2023, 1:50 PM      [1] No  Known Allergies [2]  Current Outpatient Medications on File Prior to Visit  Medication Sig Dispense Refill   Ashwagandha 125 MG CAPS Take 1 capsule by mouth every evening.     B Complex Vitamins (VITAMIN B-COMPLEX) TABS Take 1 tablet by mouth daily.     influenza vac split quadrivalent PF (FLUARIX) 0.5 ML injection ADM 0.5ML IM UTD     influenza vac split quadrivalent PF (FLUZONE QUADRIVALENT) 0.5 ML injection Fluzone Quad 2018-19(PF) 60 mcg(15 mcgx4)/0.5 mL intramuscular syringe  ADM  0.5ML IM UTD     influenza vaccine (FLUCELVAX - EGG FREE) 0.5 ML injection ADM 0.5ML IM UTD     polycarbophil (FIBERCON) 625 MG tablet Take 625 mg by mouth daily.     No current facility-administered medications on file prior to visit.

## 2023-12-31 NOTE — Patient Instructions (Addendum)
 Medication Instructions:  Dr. Mona recommends Repatha (PCSK9). This is an injectable cholesterol medication self-administered once every 14 days. This medication will likely need prior approval with your insurance company, which we will work on. If the medication is not approved initially, we may need to do an appeal with your insurance. If approved, we will provide you with copay and cost information. We'll then send the prescription to your pharmacy. We would have you complete another set of fasting labs between 3-4 months to reassess cholesterol.   Repatha is self-injected once every 14 days in subcutaneous or fatty tissue - such as belly or side/outer/upper thigh. It is best stored in the refrigerator but is stable at room temp up to 28 days. Please take the pen-injector out of fridge about 30 minutes - 1 hour prior to injection, to allow it to warm closer to room temperature.  Repatha, a monoclonal antibody therapy, lowers LDL by an average of 55-63%. It can also lower LP(a). It has also been studied and demonstrates cardiovascular risk reduction in persons with and without a prior cardiac history (such as heart attack or stroke). Here is more info: mrfebruary.hu  PCSK9 (a protein) binds to LDL receptors in the liver which results in breakdown of the LDL receptor. This prevents the liver from clearing out LDL (bad cholesterol). Repatha is a PCSK9 inhibitor, meaning it blocks this pathway.   Most common side effects:  Injection site reaction (similar findings in study group with medication and placebo group without medication) Runny nose, sore throat or symptoms of common cold which are often self-limiting and improve after subsequent injections as your body gets accustomed to the medication.    Here is a demo video: [email or web address].schwartz.org/   If you need a co-pay card for Repatha: lawsponsor.fr  Patient  Assistance:    These foundations have funds at various times.   The [employer] Foundation: https://www.panfoundation.org/disease-funds/hypercholesterolemia/ -- can sign up for wait list  The [facility] offers assistance to help pay for medication copays.  They will cover copays for all cholesterol lowering meds, including statins, fibrates, omega-3 fish oils like Vascepa, ezetimibe, Repatha, Praluent, Nexletol, Nexlizet.  The cards are usually good for $2,500 or 12 months, whichever comes first. Our fax # is [phone] (you will need this to apply) Go to healthwellfoundation.org Click on Apply Now Answer questions as to whom is applying (patient or representative) Your disease fund will be hypercholesterolemia - Medicare access They will ask questions about finances and which medications you are taking for cholesterol When you submit, the approval is usually within minutes.  You will need to print the card information from the site You will need to show this information to your pharmacy, they will bill your Medicare Part D plan first -then bill [employer] Well --for the copay.   You can also call them at [phone], although the hold times can be quite long.    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 ID: [id number]

## 2023-12-31 NOTE — Telephone Encounter (Signed)
 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 ID: [id number]

## 2024-01-01 ENCOUNTER — Telehealth: Payer: Self-pay | Admitting: Pharmacy Technician

## 2024-01-01 ENCOUNTER — Other Ambulatory Visit (HOSPITAL_COMMUNITY): Payer: Self-pay

## 2024-01-01 NOTE — Telephone Encounter (Signed)
° °  Pharmacy Patient Advocate Encounter   Received notification from Pt Calls Messages that prior authorization for repatha is required/requested.   Insurance verification completed.   The patient is insured through [facility].   Per test claim: PA required; PA submitted to above mentioned insurance via Latent Key/confirmation #/EOC [id number] Status is pending

## 2024-01-04 ENCOUNTER — Other Ambulatory Visit (HOSPITAL_COMMUNITY): Payer: Self-pay

## 2024-01-04 NOTE — Telephone Encounter (Signed)
 Pharmacy Patient Advocate Encounter  Received notification from [facility] that Prior Authorization for repatha  has been APPROVED from 01/01/24 to 12/31/26. Ran test claim, Copay is $30.00. This test claim was processed through [facility]- copay amounts may vary at other pharmacies due to pharmacy/plan contracts, or as the patient moves through the different stages of their insurance plan.   PA #/Case ID/Reference #: [id number]

## 2024-01-04 NOTE — Telephone Encounter (Signed)
 MyChart message sent w/update on med approval

## 2024-01-05 MED ORDER — REPATHA SURECLICK 140 MG/ML ~~LOC~~ SOAJ: 140.0000 mg | SUBCUTANEOUS | 3 refills | Status: AC

## 2024-01-05 NOTE — Addendum Note (Signed)
 Addended by: LORING ANDRIETTE HERO on: 01/05/2024 07:28 AM   Modules accepted: Orders

## 2024-01-29 ENCOUNTER — Encounter: Payer: Self-pay | Admitting: Internal Medicine

## 2024-02-03 ENCOUNTER — Encounter (HOSPITAL_BASED_OUTPATIENT_CLINIC_OR_DEPARTMENT_OTHER): Payer: Self-pay | Admitting: Internal Medicine
